# Patient Record
Sex: Male | Born: 1960 | Race: White | Hispanic: No | Marital: Married | Smoking: Current every day smoker
Health system: Southern US, Community
[De-identification: ages and names within clinical notes are randomized; demographics above are authoritative.]

## PROBLEM LIST (undated history)

## (undated) DIAGNOSIS — I10 Essential (primary) hypertension: Secondary | ICD-10-CM

## (undated) DIAGNOSIS — E7800 Pure hypercholesterolemia, unspecified: Secondary | ICD-10-CM

## (undated) HISTORY — PX: SHOULDER SURGERY: SHX246

## (undated) HISTORY — PX: WRIST SURGERY: SHX841

---

## 2014-09-12 ENCOUNTER — Emergency Department (HOSPITAL_BASED_OUTPATIENT_CLINIC_OR_DEPARTMENT_OTHER): Payer: Worker's Compensation

## 2014-09-12 ENCOUNTER — Encounter (HOSPITAL_BASED_OUTPATIENT_CLINIC_OR_DEPARTMENT_OTHER): Payer: Self-pay

## 2014-09-12 ENCOUNTER — Emergency Department (HOSPITAL_BASED_OUTPATIENT_CLINIC_OR_DEPARTMENT_OTHER)
Admission: EM | Admit: 2014-09-12 | Discharge: 2014-09-12 | Disposition: A | Discharge status: Home / Self Care | Payer: Worker's Compensation | Attending: Emergency Medicine | Admitting: Emergency Medicine

## 2014-09-12 DIAGNOSIS — I10 Essential (primary) hypertension: Secondary | ICD-10-CM | POA: Diagnosis not present

## 2014-09-12 DIAGNOSIS — Y998 Other external cause status: Secondary | ICD-10-CM | POA: Diagnosis not present

## 2014-09-12 DIAGNOSIS — Y9389 Activity, other specified: Secondary | ICD-10-CM | POA: Diagnosis not present

## 2014-09-12 DIAGNOSIS — S4991XA Unspecified injury of right shoulder and upper arm, initial encounter: Secondary | ICD-10-CM | POA: Diagnosis not present

## 2014-09-12 DIAGNOSIS — Z720 Tobacco use: Secondary | ICD-10-CM | POA: Insufficient documentation

## 2014-09-12 DIAGNOSIS — M25511 Pain in right shoulder: Secondary | ICD-10-CM

## 2014-09-12 DIAGNOSIS — X58XXXA Exposure to other specified factors, initial encounter: Secondary | ICD-10-CM | POA: Diagnosis not present

## 2014-09-12 DIAGNOSIS — E780 Pure hypercholesterolemia: Secondary | ICD-10-CM | POA: Insufficient documentation

## 2014-09-12 DIAGNOSIS — Z79899 Other long term (current) drug therapy: Secondary | ICD-10-CM | POA: Diagnosis not present

## 2014-09-12 DIAGNOSIS — Y9289 Other specified places as the place of occurrence of the external cause: Secondary | ICD-10-CM | POA: Insufficient documentation

## 2014-09-12 HISTORY — DX: Pure hypercholesterolemia, unspecified: E78.00

## 2014-09-12 HISTORY — DX: Essential (primary) hypertension: I10

## 2014-09-12 MED ORDER — OXYCODONE-ACETAMINOPHEN 5-325 MG PO TABS: 1.0000 | ORAL_TABLET | ORAL | Status: DC | PRN

## 2014-09-12 NOTE — Discharge Instructions (Signed)
Please rest and ice and use ibuprofen 400 mg 3 times a day. Percocet can be used for breakthrough pain in addition to the ibuprofen; do not drive or operate heavy machinery or drink alcohol all taking this medication. Please follow-up with orthopedic surgeon listed in her discharge instructions.

## 2014-09-12 NOTE — ED Notes (Signed)
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  Full sensation.

## 2014-09-12 NOTE — ED Notes (Addendum)
Information  for 24 hour pharmacy given to pt

## 2014-09-12 NOTE — ED Provider Notes (Signed)
CSN: [id number]     Arrival date & time 09/12/14  1805 History   First MD Initiated Contact with Patient 09/12/14 1908     Chief Complaint  Patient presents with  . Shoulder Pain    HPI Comments: 54 year old male presents with right shoulder pain since last night. 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 As the evening progressed he started experience painful movements and a numbness sensation in his forearm. Upon awakening this morning he was in unable to lift his right extremity due to pain but was able to use his other arm to move it through full range of motion with minimal pain. Reports the pain is more pronounced on the anterior lateral portion of his shoulder. He notes a history of injury to the shoulder required surgical management was unable to note specifically the injury. Patient has not tried any at-home therapies as he was concerned of using OTC medication with his current pressure regimen. Patient denies any other injuries or concerns during evaluation.  Patient is a 54 y.o. male presenting with shoulder pain.  Shoulder Pain   Past Medical History  Diagnosis Date  . Hypertension   . Hypercholesteremia    Past Surgical History  Procedure Laterality Date  . Wrist surgery    . Shoulder surgery     No family history on file. History  Substance Use Topics  . Smoking status: Current Every Day Smoker -- 0.50 packs/day    Types: Cigarettes  . Smokeless tobacco: Not on file  . Alcohol Use: Yes     Comment: occ    Review of Systems  All other systems reviewed and are negative.   Allergies  Review of patient's allergies indicates no known allergies.  Home Medications   Prior to Admission medications   Medication Sig Start Date End Date Taking? Authorizing Provider  atorvastatin (LIPITOR) 40 MG tablet Take 40 mg by mouth daily.   Yes  Historical Provider, MD  lisinopril (PRINIVIL,ZESTRIL) 40 MG tablet Take 40 mg by mouth daily.   Yes Historical Provider, MD   BP 161/85 mmHg  Pulse 82  Temp(Src) 98.5 F (36.9 C) (Oral)  Resp 20  Ht 5\' 9"  (1.753 m)  Wt 218 lb (98.884 kg)  BMI 32.18 kg/m2  SpO2 97% Physical Exam  Constitutional: He is oriented to person, place, and time. He appears well-developed and well-nourished.  HENT:  Head: Normocephalic and atraumatic.  Eyes: Conjunctivae are normal. Pupils are equal, round, and reactive to light. Right eye exhibits no discharge. Left eye exhibits no discharge. No scleral icterus.  Neck: Normal range of motion. No JVD present. No tracheal deviation present.  Cardiovascular: Normal rate, regular rhythm, normal heart sounds and intact distal pulses.  Exam reveals no gallop and no friction rub.   No murmur heard. Pulmonary/Chest: Effort normal and breath sounds normal. No stridor. No respiratory distress. He has no wheezes. He has no rales. He exhibits no tenderness.  Musculoskeletal:  Shoulder exam: Difficult exam due to pain. Decreased active range of motion due to pain/weakness. Full passive pain-free range of motion in all planes. No sinus shoulder asymmetry, depression, bruising swelling or signs of trauma. Decreased sensation lateral bicep from previous shoulder surgery; no new changes and sensory distal to the extremity. Distal grip strength normal bilateral, pulses intact. Pain to palpation over the anterior and medial deltoid. No  pain to palpation of clavicle scapular spine or coracoid process. No gross deformities noted.  Neurological: He is alert and oriented to person, place, and time. Coordination normal.  Psychiatric: He has a normal mood and affect. His behavior is normal. Judgment and thought content normal.  Nursing note and vitals reviewed.   ED Course  Procedures (including critical care time) Labs Review Labs Reviewed - No data to display  Imaging Review Dg  Shoulder Right  09/12/2014   CLINICAL DATA:  54 year old male with right-sided shoulder pain for 1 day.  EXAM: RIGHT SHOULDER - 2+ VIEW  COMPARISON:  No priors.  FINDINGS: Multiple views of the right shoulder demonstrate no acute displaced fracture, subluxation, dislocation, or soft tissue abnormality.  IMPRESSION: No acute radiographic abnormality of the right shoulder.   Electronically Signed   By: Trudie Reed M.D.   On: 09/12/2014 19:30     EKG Interpretation None      MDM   Final diagnoses:  Shoulder pain, acute, right    Imaging: DT shoulder normal  Assessment: Soft tissue shoulder injury/pain  Plan: Patient was given a sling and instructed to use ibuprofen and Tylenol, and ice as needed for pain and inflammation. He was also given oxycodone for breakthrough pain; instructed not to drink, drive, operate heavy machinery while taking it. The patient was instructed to follow-up with orthopedic surgeon as indicated in discharged instructions. Patient understood and agreed to plan, and agreed to follow-up immediately for new or worsening symptoms.        Eyvonne Mechanic, PA-C 09/12/14 5621  Arby Barrette, MD 09/13/14 [phone]

## 2016-09-06 ENCOUNTER — Other Ambulatory Visit: Payer: Self-pay | Admitting: Orthopedic Surgery

## 2016-09-21 ENCOUNTER — Ambulatory Visit (HOSPITAL_COMMUNITY)
Admission: RE | Admit: 2016-09-21 | Discharge: 2016-09-21 | Disposition: A | Discharge status: Home / Self Care | Payer: Worker's Compensation | Source: Ambulatory Visit | Attending: Orthopedic Surgery | Admitting: Orthopedic Surgery

## 2016-09-21 ENCOUNTER — Encounter (HOSPITAL_COMMUNITY): Payer: Self-pay

## 2016-09-21 ENCOUNTER — Encounter (HOSPITAL_COMMUNITY)
Admission: RE | Admit: 2016-09-21 | Discharge: 2016-09-21 | Disposition: A | Discharge status: Home / Self Care | Payer: Worker's Compensation | Source: Ambulatory Visit | Attending: Orthopedic Surgery | Admitting: Orthopedic Surgery

## 2016-09-21 DIAGNOSIS — Z01818 Encounter for other preprocedural examination: Secondary | ICD-10-CM | POA: Insufficient documentation

## 2016-09-21 DIAGNOSIS — M19011 Primary osteoarthritis, right shoulder: Secondary | ICD-10-CM | POA: Insufficient documentation

## 2016-09-21 LAB — ABO/RH: ABO/RH(D): O POS

## 2016-09-21 LAB — COMPREHENSIVE METABOLIC PANEL
ALK PHOS: 89 U/L (ref 38–126)
ALT: 28 U/L (ref 17–63)
AST: 19 U/L (ref 15–41)
Albumin: 4 g/dL (ref 3.5–5.0)
Anion gap: 10 (ref 5–15)
BUN: 9 mg/dL (ref 6–20)
CALCIUM: 9.2 mg/dL (ref 8.9–10.3)
CO2: 26 mmol/L (ref 22–32)
CREATININE: 1.14 mg/dL (ref 0.61–1.24)
Chloride: 104 mmol/L (ref 101–111)
Glucose, Bld: 185 mg/dL — ABNORMAL HIGH (ref 65–99)
Potassium: 4.1 mmol/L (ref 3.5–5.1)
Sodium: 140 mmol/L (ref 135–145)
Total Bilirubin: 0.7 mg/dL (ref 0.3–1.2)
Total Protein: 6.7 g/dL (ref 6.5–8.1)

## 2016-09-21 LAB — CBC WITH DIFFERENTIAL/PLATELET
Basophils Absolute: 0.1 10*3/uL (ref 0.0–0.1)
Basophils Relative: 1 %
Eosinophils Absolute: 0.3 10*3/uL (ref 0.0–0.7)
Eosinophils Relative: 3 %
HCT: 43.2 % (ref 39.0–52.0)
HEMOGLOBIN: 14.7 g/dL (ref 13.0–17.0)
LYMPHS ABS: 2.5 10*3/uL (ref 0.7–4.0)
LYMPHS PCT: 26 %
MCH: 28.6 pg (ref 26.0–34.0)
MCHC: 34 g/dL (ref 30.0–36.0)
MCV: 84 fL (ref 78.0–100.0)
Monocytes Absolute: 0.6 10*3/uL (ref 0.1–1.0)
Monocytes Relative: 6 %
NEUTROS ABS: 6.1 10*3/uL (ref 1.7–7.7)
Neutrophils Relative %: 64 %
Platelets: 300 10*3/uL (ref 150–400)
RBC: 5.14 MIL/uL (ref 4.22–5.81)
RDW: 14 % (ref 11.5–15.5)
WBC: 9.6 10*3/uL (ref 4.0–10.5)

## 2016-09-21 LAB — PROTIME-INR
INR: 0.93
PROTHROMBIN TIME: 12.5 s (ref 11.4–15.2)

## 2016-09-21 LAB — URINALYSIS, ROUTINE W REFLEX MICROSCOPIC
BILIRUBIN URINE: NEGATIVE
Bacteria, UA: NONE SEEN
GLUCOSE, UA: 50 mg/dL — AB
KETONES UR: NEGATIVE mg/dL
LEUKOCYTES UA: NEGATIVE
NITRITE: NEGATIVE
PROTEIN: NEGATIVE mg/dL
SQUAMOUS EPITHELIAL / LPF: NONE SEEN
Specific Gravity, Urine: 1.019 (ref 1.005–1.030)
pH: 8 (ref 5.0–8.0)

## 2016-09-21 LAB — SURGICAL PCR SCREEN
MRSA, PCR: NEGATIVE
Staphylococcus aureus: NEGATIVE

## 2016-09-21 LAB — TYPE AND SCREEN
ABO/RH(D): O POS
Antibody Screen: NEGATIVE

## 2016-09-21 LAB — APTT: APTT: 29 s (ref 24–36)

## 2016-09-21 NOTE — Pre-Procedure Instructions (Signed)
    Ardeen GarlandMichael Myint  09/21/2016      [id number] [place] - [address] [place] [id number] Phone: [phone] Fax: [phone]    Your procedure is scheduled on 09/28/16.  [facility] Admitting at 845 A.M.  Call this number if you have problems the morning of surgery:  [phone]   Remember:  Do not eat food or drink liquids after midnight.  Take these medicines the morning of surgery with A SIP OF WATER --oxycodone   Do not wear jewelry, make-up or nail polish.  Do not wear lotions, powders, or perfumes, or deoderant.  Do not shave 48 hours prior to surgery.  Men may shave face and neck.  Do not bring valuables to the hospital.  [facility] is not responsible for any belongings or valuables.  Contacts, dentures or bridgework may not be worn into surgery.  Leave your suitcase in the car.  After surgery it may be brought to your room.  For patients admitted to the hospital, discharge time will be determined by your treatment team.  Patients discharged the day of surgery will not be allowed to drive home.   Name and phone number of your driver:    Special instructions:  Do not take any aspirin,anti-inflammatories,vitamins,or herbal supplements 5-7 days prior to surgery.  Please read over the following fact sheets that you were given. MRSA Information

## 2016-09-28 ENCOUNTER — Inpatient Hospital Stay (HOSPITAL_COMMUNITY)
Admission: RE | Admit: 2016-09-28 | Discharge: 2016-09-29 | DRG: 483 | Disposition: A | Discharge status: Home / Self Care | Payer: Worker's Compensation | Source: Ambulatory Visit | Attending: Orthopedic Surgery | Admitting: Orthopedic Surgery

## 2016-09-28 ENCOUNTER — Inpatient Hospital Stay (HOSPITAL_COMMUNITY): Payer: Worker's Compensation

## 2016-09-28 ENCOUNTER — Inpatient Hospital Stay (HOSPITAL_COMMUNITY): Payer: Worker's Compensation | Admitting: Anesthesiology

## 2016-09-28 ENCOUNTER — Encounter (HOSPITAL_COMMUNITY)
Admission: RE | Disposition: A | Discharge status: Home / Self Care | Payer: Self-pay | Source: Ambulatory Visit | Attending: Orthopedic Surgery

## 2016-09-28 ENCOUNTER — Encounter (HOSPITAL_COMMUNITY): Payer: Self-pay | Admitting: Urology

## 2016-09-28 DIAGNOSIS — M94211 Chondromalacia, right shoulder: Secondary | ICD-10-CM | POA: Diagnosis present

## 2016-09-28 DIAGNOSIS — E7800 Pure hypercholesterolemia, unspecified: Secondary | ICD-10-CM | POA: Diagnosis present

## 2016-09-28 DIAGNOSIS — M19011 Primary osteoarthritis, right shoulder: Principal | ICD-10-CM | POA: Diagnosis present

## 2016-09-28 DIAGNOSIS — Z791 Long term (current) use of non-steroidal anti-inflammatories (NSAID): Secondary | ICD-10-CM | POA: Diagnosis not present

## 2016-09-28 DIAGNOSIS — M25711 Osteophyte, right shoulder: Secondary | ICD-10-CM | POA: Diagnosis present

## 2016-09-28 DIAGNOSIS — I10 Essential (primary) hypertension: Secondary | ICD-10-CM | POA: Diagnosis present

## 2016-09-28 DIAGNOSIS — Z96611 Presence of right artificial shoulder joint: Secondary | ICD-10-CM

## 2016-09-28 DIAGNOSIS — Z7982 Long term (current) use of aspirin: Secondary | ICD-10-CM | POA: Diagnosis not present

## 2016-09-28 DIAGNOSIS — F17210 Nicotine dependence, cigarettes, uncomplicated: Secondary | ICD-10-CM | POA: Diagnosis present

## 2016-09-28 HISTORY — PX: TOTAL SHOULDER ARTHROPLASTY: SHX126

## 2016-09-28 SURGERY — ARTHROPLASTY, SHOULDER, TOTAL
Anesthesia: Regional | Laterality: Right

## 2016-09-28 MED ORDER — ASPIRIN EC 325 MG PO TBEC
325.0000 mg | DELAYED_RELEASE_TABLET | Freq: Every day | ORAL | Status: DC
  Administered 2016-09-28 – 2016-09-29 (×2): 325 mg via ORAL
  Filled 2016-09-28 (×2): qty 1

## 2016-09-28 MED ORDER — FENTANYL CITRATE (PF) 100 MCG/2ML IJ SOLN
INTRAMUSCULAR | Status: AC
  Administered 2016-09-28: 100 ug
  Filled 2016-09-28: qty 2

## 2016-09-28 MED ORDER — ROCURONIUM BROMIDE 50 MG/5ML IV SOSY
PREFILLED_SYRINGE | INTRAVENOUS | Status: AC
  Filled 2016-09-28: qty 5

## 2016-09-28 MED ORDER — HYDROMORPHONE HCL 1 MG/ML IJ SOLN: 0.2500 mg | INTRAMUSCULAR | Status: DC | PRN

## 2016-09-28 MED ORDER — PROPOFOL 10 MG/ML IV BOLUS
INTRAVENOUS | Status: DC | PRN
  Administered 2016-09-28: 200 mg via INTRAVENOUS

## 2016-09-28 MED ORDER — METHOCARBAMOL 500 MG PO TABS
500.0000 mg | ORAL_TABLET | Freq: Four times a day (QID) | ORAL | Status: DC | PRN
  Administered 2016-09-28 – 2016-09-29 (×2): 500 mg via ORAL
  Filled 2016-09-28 (×2): qty 1

## 2016-09-28 MED ORDER — MIDAZOLAM HCL 2 MG/2ML IJ SOLN
INTRAMUSCULAR | Status: AC
  Administered 2016-09-28: 2 mg
  Filled 2016-09-28: qty 2

## 2016-09-28 MED ORDER — SUCCINYLCHOLINE CHLORIDE 20 MG/ML IJ SOLN
INTRAMUSCULAR | Status: DC | PRN
  Administered 2016-09-28: 130 mg via INTRAVENOUS

## 2016-09-28 MED ORDER — DOCUSATE SODIUM 100 MG PO CAPS
100.0000 mg | ORAL_CAPSULE | Freq: Two times a day (BID) | ORAL | Status: DC
  Administered 2016-09-28 – 2016-09-29 (×2): 100 mg via ORAL
  Filled 2016-09-28 (×2): qty 1

## 2016-09-28 MED ORDER — PROPOFOL 10 MG/ML IV BOLUS
INTRAVENOUS | Status: AC
  Filled 2016-09-28: qty 20

## 2016-09-28 MED ORDER — SODIUM CHLORIDE 0.9% FLUSH
INTRAVENOUS | Status: DC | PRN
  Administered 2016-09-28 (×2): 10 mL

## 2016-09-28 MED ORDER — SCOPOLAMINE 1 MG/3DAYS TD PT72SCOPOLAMINE 1 MG/3DAYS
1.0000 | MEDICATED_PATCH | TRANSDERMAL | Status: DC
Start: 2016-09-28 — End: 2016-09-28
  Administered 2016-09-28: 1.5 mg via TRANSDERMAL
  Filled 2016-09-28: qty 1

## 2016-09-28 MED ORDER — SODIUM CHLORIDE 0.9 % IV SOLN: INTRAVENOUS | Status: DC

## 2016-09-28 MED ORDER — DIPHENHYDRAMINE HCL 12.5 MG/5ML PO ELIX: 12.5000 mg | ORAL_SOLUTION | ORAL | Status: DC | PRN

## 2016-09-28 MED ORDER — 0.9 % SODIUM CHLORIDE (POUR BTL) OPTIME
TOPICAL | Status: DC | PRN
  Administered 2016-09-28: 1000 mL

## 2016-09-28 MED ORDER — ONDANSETRON HCL 4 MG/2ML IJ SOLN
INTRAMUSCULAR | Status: DC | PRN
  Administered 2016-09-28: 4 mg via INTRAVENOUS

## 2016-09-28 MED ORDER — METOCLOPRAMIDE HCL 5 MG/ML IJ SOLN: 5.0000 mg | Freq: Three times a day (TID) | INTRAMUSCULAR | Status: DC | PRN

## 2016-09-28 MED ORDER — MENTHOL 3 MG MT LOZG: 1.0000 | LOZENGE | OROMUCOSAL | Status: DC | PRN

## 2016-09-28 MED ORDER — BUPIVACAINE-EPINEPHRINE (PF) 0.5% -1:200000 IJ SOLN
INTRAMUSCULAR | Status: DC | PRN
  Administered 2016-09-28: 30 mL via PERINEURAL

## 2016-09-28 MED ORDER — SENNOSIDES-DOCUSATE SODIUM 8.6-50 MG PO TABS: 1.0000 | ORAL_TABLET | Freq: Every evening | ORAL | Status: DC | PRN

## 2016-09-28 MED ORDER — METHOCARBAMOL 1000 MG/10ML IJ SOLN
500.0000 mg | Freq: Four times a day (QID) | INTRAVENOUS | Status: DC | PRN
  Filled 2016-09-28: qty 5

## 2016-09-28 MED ORDER — PROMETHAZINE HCL 25 MG/ML IJ SOLN: 6.2500 mg | INTRAMUSCULAR | Status: DC | PRN

## 2016-09-28 MED ORDER — GLYCOPYRROLATE 0.2 MG/ML IJ SOLN
INTRAMUSCULAR | Status: DC | PRN
  Administered 2016-09-28: 0.4 mg via INTRAVENOUS

## 2016-09-28 MED ORDER — ONDANSETRON HCL 4 MG/2ML IJ SOLN: 4.0000 mg | Freq: Four times a day (QID) | INTRAMUSCULAR | Status: DC | PRN

## 2016-09-28 MED ORDER — STERILE WATER FOR IRRIGATION IR SOLN
Status: DC | PRN
  Administered 2016-09-28: 1000 mL

## 2016-09-28 MED ORDER — METOCLOPRAMIDE HCL 5 MG PO TABS: 5.0000 mg | ORAL_TABLET | Freq: Three times a day (TID) | ORAL | Status: DC | PRN

## 2016-09-28 MED ORDER — LACTATED RINGERS IV SOLN
INTRAVENOUS | Status: DC
  Administered 2016-09-28 (×2): via INTRAVENOUS

## 2016-09-28 MED ORDER — MIDAZOLAM HCL 2 MG/2ML IJ SOLN
INTRAMUSCULAR | Status: AC
  Filled 2016-09-28: qty 2

## 2016-09-28 MED ORDER — ALBUTEROL SULFATE HFA 108 (90 BASE) MCG/ACT IN AERS
INHALATION_SPRAY | RESPIRATORY_TRACT | Status: AC
  Filled 2016-09-28: qty 6.7

## 2016-09-28 MED ORDER — POVIDONE-IODINE 7.5 % EX SOLN
Freq: Once | CUTANEOUS | Status: DC
  Filled 2016-09-28: qty 118

## 2016-09-28 MED ORDER — DOCUSATE SODIUM 100 MG PO CAPS
100.0000 mg | ORAL_CAPSULE | Freq: Three times a day (TID) | ORAL | 0 refills | Status: AC | PRN
Start: 2016-09-28 — End: ?

## 2016-09-28 MED ORDER — LIDOCAINE 2% (20 MG/ML) 5 ML SYRINGE
INTRAMUSCULAR | Status: AC
  Filled 2016-09-28: qty 5

## 2016-09-28 MED ORDER — PHENYLEPHRINE HCL 10 MG/ML IJ SOLN
INTRAVENOUS | Status: DC | PRN
  Administered 2016-09-28: 15 ug/min via INTRAVENOUS

## 2016-09-28 MED ORDER — ZOLPIDEM TARTRATE 5 MG PO TABS: 5.0000 mg | ORAL_TABLET | Freq: Every evening | ORAL | Status: DC | PRN

## 2016-09-28 MED ORDER — FENTANYL CITRATE (PF) 250 MCG/5ML IJ SOLN
INTRAMUSCULAR | Status: AC
  Filled 2016-09-28: qty 5

## 2016-09-28 MED ORDER — OXYCODONE-ACETAMINOPHEN 5-325 MG PO TABS: 1.0000 | ORAL_TABLET | ORAL | 0 refills | Status: AC | PRN

## 2016-09-28 MED ORDER — ROCURONIUM BROMIDE 100 MG/10ML IV SOLN
INTRAVENOUS | Status: DC | PRN
  Administered 2016-09-28: 50 mg via INTRAVENOUS

## 2016-09-28 MED ORDER — MORPHINE SULFATE (PF) 2 MG/ML IV SOLN
1.0000 mg | INTRAVENOUS | Status: DC | PRN
  Administered 2016-09-28 – 2016-09-29 (×7): 2 mg via INTRAVENOUS
  Filled 2016-09-28 (×7): qty 1

## 2016-09-28 MED ORDER — PHENOL 1.4 % MT LIQD: 1.0000 | OROMUCOSAL | Status: DC | PRN

## 2016-09-28 MED ORDER — BISACODYL 5 MG PO TBEC
5.0000 mg | DELAYED_RELEASE_TABLET | Freq: Every day | ORAL | Status: DC | PRN
Start: 2016-09-28 — End: 2016-09-29

## 2016-09-28 MED ORDER — CEFAZOLIN SODIUM-DEXTROSE 2-4 GM/100ML-% IV SOLN
2.0000 g | INTRAVENOUS | Status: AC
  Administered 2016-09-28: 2 g via INTRAVENOUS
  Filled 2016-09-28: qty 100

## 2016-09-28 MED ORDER — ALUMINUM HYDROXIDE GEL 320 MG/5ML PO SUSP: 15.0000 mL | ORAL | Status: DC | PRN

## 2016-09-28 MED ORDER — BUPIVACAINE LIPOSOME 1.3 % IJ SUSP
20.0000 mL | INTRAMUSCULAR | Status: AC
  Administered 2016-09-28: 20 mL
  Filled 2016-09-28: qty 20

## 2016-09-28 MED ORDER — FLEET ENEMA 7-19 GM/118ML RE ENEM: 1.0000 | ENEMA | Freq: Once | RECTAL | Status: DC | PRN

## 2016-09-28 MED ORDER — TRANEXAMIC ACID 1000 MG/10ML IV SOLN
1000.0000 mg | INTRAVENOUS | Status: AC
  Administered 2016-09-28: 1000 mg via INTRAVENOUS
  Filled 2016-09-28: qty 10

## 2016-09-28 MED ORDER — OXYCODONE HCL 5 MG PO TABS
5.0000 mg | ORAL_TABLET | ORAL | Status: DC | PRN
  Administered 2016-09-28: 5 mg via ORAL
  Administered 2016-09-29: 10 mg via ORAL
  Filled 2016-09-28: qty 1
  Filled 2016-09-28: qty 2

## 2016-09-28 MED ORDER — ACETAMINOPHEN 500 MG PO TABS
1000.0000 mg | ORAL_TABLET | Freq: Four times a day (QID) | ORAL | Status: AC
  Administered 2016-09-28 – 2016-09-29 (×4): 1000 mg via ORAL
  Filled 2016-09-28 (×4): qty 2

## 2016-09-28 MED ORDER — CEFAZOLIN SODIUM-DEXTROSE 2-4 GM/100ML-% IV SOLN
2.0000 g | Freq: Three times a day (TID) | INTRAVENOUS | Status: AC
  Administered 2016-09-28 – 2016-09-29 (×3): 2 g via INTRAVENOUS
  Filled 2016-09-28 (×4): qty 100

## 2016-09-28 MED ORDER — NEOSTIGMINE METHYLSULFATE 10 MG/10ML IV SOLN
INTRAVENOUS | Status: DC | PRN
  Administered 2016-09-28: 3 mg via INTRAVENOUS

## 2016-09-28 MED ORDER — SODIUM CHLORIDE 0.9 % IR SOLN
Status: DC | PRN
  Administered 2016-09-28: 3000 mL

## 2016-09-28 MED ORDER — ONDANSETRON HCL 4 MG PO TABS: 4.0000 mg | ORAL_TABLET | Freq: Four times a day (QID) | ORAL | Status: DC | PRN

## 2016-09-28 SURGICAL SUPPLY — 73 items
AEQUALIS PERFORM GUIDE WIRE COCR ×3 IMPLANT
BIT DRILL 5/64X5 DISP (BIT) ×3 IMPLANT
BLADE SAW SAG 73X25 THK (BLADE) ×2
BLADE SAW SGTL 73X25 THK (BLADE) ×1 IMPLANT
BLADE SURG 15 STRL LF DISP TIS (BLADE) ×1 IMPLANT
BLADE SURG 15 STRL SS (BLADE) ×2
CAP SHOULDER TOTAL 2 ×3 IMPLANT
CEMENT BONE DEPUY (Cement) ×3 IMPLANT
CHLORAPREP W/TINT 26ML (MISCELLANEOUS) ×3 IMPLANT
CLOSURE STERI-STRIP 1/2X4 (GAUZE/BANDAGES/DRESSINGS) ×1
CLOSURE WOUND 1/2 X4 (GAUZE/BANDAGES/DRESSINGS) ×1
CLSR STERI-STRIP ANTIMIC 1/2X4 (GAUZE/BANDAGES/DRESSINGS) ×2 IMPLANT
COVER SURGICAL LIGHT HANDLE (MISCELLANEOUS) ×3 IMPLANT
DRAPE INCISE IOBAN 66X45 STRL (DRAPES) ×3 IMPLANT
DRAPE ORTHO SPLIT 77X108 STRL (DRAPES) ×4
DRAPE SURG 17X23 STRL (DRAPES) ×3 IMPLANT
DRAPE SURG ORHT 6 SPLT 77X108 (DRAPES) ×2 IMPLANT
DRAPE U-SHAPE 47X51 STRL (DRAPES) ×3 IMPLANT
DRSG AQUACEL AG ADV 3.5X10 (GAUZE/BANDAGES/DRESSINGS) ×3 IMPLANT
ELECT BLADE 4.0 EZ CLEAN MEGAD (MISCELLANEOUS)
ELECT REM PT RETURN 9FT ADLT (ELECTROSURGICAL) ×3
ELECTRODE BLDE 4.0 EZ CLN MEGD (MISCELLANEOUS) IMPLANT
ELECTRODE REM PT RTRN 9FT ADLT (ELECTROSURGICAL) ×1 IMPLANT
EVACUATOR 1/8 PVC DRAIN (DRAIN) IMPLANT
GLOVE BIO SURGEON STRL SZ7 (GLOVE) ×6 IMPLANT
GLOVE BIO SURGEON STRL SZ7.5 (GLOVE) ×3 IMPLANT
GLOVE BIOGEL PI IND STRL 7.0 (GLOVE) ×1 IMPLANT
GLOVE BIOGEL PI IND STRL 8 (GLOVE) ×1 IMPLANT
GLOVE BIOGEL PI INDICATOR 7.0 (GLOVE) ×2
GLOVE BIOGEL PI INDICATOR 8 (GLOVE) ×2
GOWN STRL REUS W/ TWL LRG LVL3 (GOWN DISPOSABLE) ×1 IMPLANT
GOWN STRL REUS W/ TWL XL LVL3 (GOWN DISPOSABLE) ×1 IMPLANT
GOWN STRL REUS W/TWL LRG LVL3 (GOWN DISPOSABLE) ×2
GOWN STRL REUS W/TWL XL LVL3 (GOWN DISPOSABLE) ×2
HANDPIECE INTERPULSE COAX TIP (DISPOSABLE) ×2
HEMOSTAT SURGICEL 2X14 (HEMOSTASIS) ×3 IMPLANT
HOOD PEEL AWAY FLYTE STAYCOOL (MISCELLANEOUS) ×6 IMPLANT
IV NS IRRIG 3000ML ARTHROMATIC (IV SOLUTION) ×3 IMPLANT
KIT BASIN OR (CUSTOM PROCEDURE TRAY) ×3 IMPLANT
KIT ROOM TURNOVER OR (KITS) ×3 IMPLANT
MANIFOLD NEPTUNE II (INSTRUMENTS) ×3 IMPLANT
NEEDLE HYPO 25GX1X1/2 BEV (NEEDLE) IMPLANT
NEEDLE MAYO TROCAR (NEEDLE) ×3 IMPLANT
NEEDLE SPNL 18GX3.5 QUINCKE PK (NEEDLE) ×6 IMPLANT
NS IRRIG 1000ML POUR BTL (IV SOLUTION) ×3 IMPLANT
PACK SHOULDER (CUSTOM PROCEDURE TRAY) ×3 IMPLANT
PAD ARMBOARD 7.5X6 YLW CONV (MISCELLANEOUS) ×6 IMPLANT
RESTRAINT HEAD UNIVERSAL NS (MISCELLANEOUS) ×3 IMPLANT
RETRIEVER SUT HEWSON (MISCELLANEOUS) ×3 IMPLANT
SET HNDPC FAN SPRY TIP SCT (DISPOSABLE) ×1 IMPLANT
SLING ARM IMMOBILIZER LRG (SOFTGOODS) ×3 IMPLANT
SLING ARM IMMOBILIZER MED (SOFTGOODS) IMPLANT
SMARTMIX MINI TOWER (MISCELLANEOUS) ×3
SPONGE LAP 18X18 X RAY DECT (DISPOSABLE) IMPLANT
SPONGE LAP 4X18 X RAY DECT (DISPOSABLE) IMPLANT
STRIP CLOSURE SKIN 1/2X4 (GAUZE/BANDAGES/DRESSINGS) ×2 IMPLANT
SUCTION FRAZIER HANDLE 10FR (MISCELLANEOUS) ×2
SUCTION TUBE FRAZIER 10FR DISP (MISCELLANEOUS) ×1 IMPLANT
SUPPORT WRAP ARM LG (MISCELLANEOUS) ×3 IMPLANT
SUT ETHIBOND NAB CT1 #1 30IN (SUTURE) ×9 IMPLANT
SUT MNCRL AB 4-0 PS2 18 (SUTURE) ×3 IMPLANT
SUT SILK 2 0 TIES 17X18 (SUTURE)
SUT SILK 2-0 18XBRD TIE BLK (SUTURE) IMPLANT
SUT VIC AB 2-0 CT1 27 (SUTURE) ×2
SUT VIC AB 2-0 CT1 TAPERPNT 27 (SUTURE) ×1 IMPLANT
SYR 50ML LL SCALE MARK (SYRINGE) ×6 IMPLANT
SYR CONTROL 10ML LL (SYRINGE) IMPLANT
TAPE LABRALWHITE 1.5X36 (TAPE) ×6 IMPLANT
TAPE SUT LABRALTAP WHT/BLK (SUTURE) ×3 IMPLANT
TOWEL OR 17X24 6PK STRL BLUE (TOWEL DISPOSABLE) ×3 IMPLANT
TOWEL OR 17X26 10 PK STRL BLUE (TOWEL DISPOSABLE) ×3 IMPLANT
TOWER SMARTMIX MINI (MISCELLANEOUS) ×1 IMPLANT
WATER STERILE IRR 1000ML POUR (IV SOLUTION) ×3 IMPLANT

## 2016-09-28 NOTE — H&P (Signed)
Drew Curtis is an 56 y.o. male.   Chief Complaint: R shoulder pain and dysfunction  HPI: s/p injury at work with R shoulder severe chondromalacia, failed conservative treatment including injections, meds, activity modification and arthroscopic debridement. Pain interferes with sleep, work and quality of life.  Past Medical History:  Diagnosis Date  . Hypercholesteremia   . Hypertension    no meds    Past Surgical History:  Procedure Laterality Date  . SHOULDER SURGERY    . WRIST SURGERY      History reviewed. No pertinent family history. Social History:  reports that he has been smoking Cigarettes.  He has a 17.50 pack-year smoking history. He has never used smokeless tobacco. He reports that he drinks alcohol. He reports that he does not use drugs.  Allergies: No Known Allergies  Medications Prior to Admission  Medication Sig Dispense Refill  . aspirin EC 81 MG tablet Take 81 mg by mouth daily as needed for mild pain.     . meloxicam (MOBIC) 7.5 MG tablet Take 7.5 mg by mouth 2 (two) times daily as needed for pain.   0  . oxyCODONE-acetaminophen (ROXICET) 5-325 MG per tablet Take 1-2 tablets by mouth every 4 (four) hours as needed for severe pain. (Patient not taking: Reported on 09/15/2016) 15 tablet 0    No results found for this or any previous visit (from the past 48 hour(s)). No results found.  Review of Systems  All other systems reviewed and are negative.   Blood pressure (!) 164/85, pulse 74, temperature 98.8 F (37.1 C), temperature source Oral, resp. rate 19, SpO2 100 %. Physical Exam  Constitutional: He is oriented to person, place, and time. He appears well-developed and well-nourished.  HENT:  Head: Atraumatic.  Eyes: EOM are normal.  Cardiovascular: Intact distal pulses.   Respiratory: Effort normal.  Musculoskeletal:  R shoulder pain with ROM. NVID  Neurological: He is alert and oriented to person, place, and time.  Skin: Skin is warm and dry.   Psychiatric: He has a normal mood and affect.     Assessment/Plan  s/p injury at work with R shoulder severe chondromalacia, failed conservative treatment including injections, meds, activity modification and arthroscopic debridement. Pain interferes with sleep, work and quality of life.  Plan R TSA Risks / benefits of surgery discussed Consent on chart  NPO for OR Preop antibiotics   Mable ParisHANDLER,Drew Curtis WILLIAM, MD 09/28/2016, 9:42 AM

## 2016-09-28 NOTE — Discharge Instructions (Signed)
Discharge Instructions after Total Shoulder Arthroplasty   A sling has been provided for you. Remove the sling 5 times each day to perform motion exercises. Keep wearing your sling (expect for bathing and dressing) until your first visit with Dr. Ave Filterhandler. Use ice on the shoulder intermittently over the first 48 hours after surgery.  Pain medication has been prescribed for you.  Use your medication liberally over the first 48 hours, and then begin to taper your use. You may take Extra Strength Tylenol or Tylenol only in place of the pain pills. DO NOT take ANY nonsteroidal anti-inflammatory pain medications: Advil, Motrin, Ibuprofen, Aleve, Naproxen, or Naprosyn. Take one aspirin a day for 2 weeks after surgery, unless you have an aspirin sensitivity/allergy or asthma. Leave your dressing on until your first follow up visit.  You may shower with the dressing.  Hold your arm as if you still have your sling on while you shower. Active reaching and lifting are not permitted. You may use the operative arm for activities of daily living that do not require the operative arm to leave the side of the body, such as eating, drinking, bathing, etc.  Three to 5 times each day you should perform assisted overhead reaching and external rotation (outward turning) exercises with the operative arm. You were taught these exercises prior to discharge. Both exercises should be done with the non-operative arm used as the "therapist arm" while the operative arm remains relaxed. Ten of each exercise should be done three to five times each day.   Overhead reach is helping to lift your stiff arm up as high as it will go. To stretch your overhead reach, lie flat on your back, relax, and grasp the wrist of the tight shoulder with your opposite hand. Using the power in your opposite arm, bring the stiff arm up as far as it is comfortable. Start holding it for ten seconds and then work up to where you can hold it for a count of  30. Breathe slowly and deeply while the arm is moved. Repeat this stretch ten times, trying to help the ar up a little higher each time.     External rotation is turning the arm out to the side while your elbow stays close to your body. External rotation is best stretched while you are lying on your back. Hold a cane, yardstick, broom handle, or dowel in both hands. Bend both elbows to a right angle. Use steady, gentle force from your normal arm to rotate the hand of the stiff shoulder out away from your body. Continue the rotation as far as it will go comfortably, holding it there for a count of 10. Repeat this exercise ten times.      Please call [phone] during normal business hours or [phone] after hours for any problems. Including the following:  - excessive redness of the incisions - drainage for more than 4 days - fever of more than 101.5 F  *Please note that pain medications will not be refilled after hours or on weekends.

## 2016-09-28 NOTE — Anesthesia Postprocedure Evaluation (Addendum)
Anesthesia Post Note  Patient: Ardeen GarlandMichael Ofarrell  Procedure(s) Performed: Procedure(s) (LRB): TOTAL SHOULDER ARTHROPLASTY (Right)  Patient location during evaluation: PACU Anesthesia Type: Regional Level of consciousness: sedated Pain management: pain level controlled Vital Signs Assessment: post-procedure vital signs reviewed and stable Respiratory status: spontaneous breathing and respiratory function stable Cardiovascular status: stable Anesthetic complications: no       Last Vitals:  Vitals:   09/28/16 1325 09/28/16 1340  BP: 131/79 128/78  Pulse: (!) 55 (!) 56  Resp: 17 20  Temp:      Last Pain:  Vitals:   09/28/16 0808  TempSrc: Oral                 Bryton Waight DANIEL

## 2016-09-28 NOTE — Anesthesia Procedure Notes (Signed)
Anesthesia Regional Block: Interscalene brachial plexus block   Pre-Anesthetic Checklist: ,, timeout performed, Correct Patient, Correct Site, Correct Laterality, Correct Procedure,, site marked, risks and benefits discussed, Surgical consent,  Pre-op evaluation,  At surgeon's request and post-op pain management  Laterality: Right  Prep: chloraprep       Needles:  Injection technique: Single-shot  Needle Type: Echogenic Stimulator Needle     Needle Length: 5cm  Needle Gauge: 22     Additional Needles:   Procedures: ultrasound guided, nerve stimulator,,,,,,   Nerve Stimulator or Paresthesia:  Response: bicep contraction, 0.48 mA,   Additional Responses:   Narrative:  Start time: 09/28/2016 9:11 AM End time: 09/28/2016 9:21 AM Injection made incrementally with aspirations every 5 mL.  Performed by: Personally   Additional Notes: Functioning IV was confirmed and monitors applied.  A 50mm 22ga echogenic arrow stimulator was used. Sterile prep and drape,hand hygiene and sterile gloves were used.Ultrasound guidance: relevent anatomy identified, needle position confirmed, local anesthetic spread visualized around nerve(s)., vascular puncture avoided.  Image printed for medical record.  Negative aspiration and negative test dose prior to incremental administration of local anesthetic. The patient tolerated the procedure well.

## 2016-09-28 NOTE — Anesthesia Preprocedure Evaluation (Addendum)
Anesthesia Evaluation  Patient identified by MRN, date of birth, ID band Patient awake    Reviewed: Allergy & Precautions, NPO status , Patient's Chart, lab work & pertinent test results  History of Anesthesia Complications Negative for: history of anesthetic complications  Airway Mallampati: III  TM Distance: >3 FB Neck ROM: Full    Dental no notable dental hx. (+) Dental Advisory Given   Pulmonary Current Smoker,    Pulmonary exam normal        Cardiovascular hypertension, Normal cardiovascular exam     Neuro/Psych negative neurological ROS  negative psych ROS   GI/Hepatic negative GI ROS, Neg liver ROS,   Endo/Other  negative endocrine ROS  Renal/GU negative Renal ROS  negative genitourinary   Musculoskeletal negative musculoskeletal ROS (+)   Abdominal   Peds negative pediatric ROS (+)  Hematology negative hematology ROS (+)   Anesthesia Other Findings   Reproductive/Obstetrics negative OB ROS                            Anesthesia Physical Anesthesia Plan  ASA: II  Anesthesia Plan: General   Post-op Pain Management: GA combined w/ Regional for post-op pain   Induction:   Airway Management Planned: Oral ETT  Additional Equipment:   Intra-op Plan:   Post-operative Plan: Extubation in OR  Informed Consent: I have reviewed the patients History and Physical, chart, labs and discussed the procedure including the risks, benefits and alternatives for the proposed anesthesia with the patient or authorized representative who has indicated his/her understanding and acceptance.   Dental advisory given  Plan Discussed with: CRNA, Anesthesiologist and Surgeon  Anesthesia Plan Comments:        Anesthesia Quick Evaluation

## 2016-09-28 NOTE — Progress Notes (Signed)
Patient does not want flu vaccine. 

## 2016-09-28 NOTE — Op Note (Signed)
Procedure(s): TOTAL SHOULDER ARTHROPLASTY Procedure Note  Drew Curtis male 56 y.o. 09/28/2016  Procedure(s) and Anesthesia Type:    *RIGHT TOTAL SHOULDER ARTHROPLASTY - Choice  Surgeon(s) and Role:    * Jones Broom, MD - Primary   Indications:  56 y.o. 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 Pain and dysfunction interfered with quality of life and nonoperative treatment with activity modification, NSAIDS, injections and arthroscopic debridement failed.     Surgeon: Mable Paris   Assistants: Damita Lack PA-C [facility] was present and scrubbed throughout the procedure and was essential in positioning, retraction, exposure, and closure)  Anesthesia: General endotracheal anesthesia with preoperative interscalene block given by the anesthesiologist    Procedure Detail  TOTAL SHOULDER ARTHROPLASTY  Findings: Tornier flex anatomic press-fit size 4 stem with a 52 head, cemented size medium 40 Cortiloc glenoid.   A lesser tuberosity osteotomy was performed and repaired at the conclusion of the procedure.  Estimated Blood Loss:  200 mL         Drains: None   Blood Given: none          Specimens: none        Complications:  * No complications entered in OR log *         Disposition: PACU - hemodynamically stable.         Condition: stable    Procedure:   The patient was identified in the preoperative holding area where I personally marked the operative extremity after verifying with the patient and consent. He  was taken to the operating room where He was transferred to the   operative table.  The patient received an interscalene block in   the holding area by the attending anesthesiologist.  General anesthesia was induced   in the operating room without complication.  The patient did receive IV  Ancef prior to the commencement of the procedure.  The patient was   placed in the beach-chair position with the back raised  about 30   degrees.  The nonoperative extremity and head and neck were carefully   positioned and padded protecting against neurovascular compromise.  The   left upper extremity was then prepped and draped in the standard sterile   fashion.    The appropriate operative time-out was performed with   Anesthesia, the perioperative staff, as well as myself and we all agreed   that the right side was the correct operative site.  An approximately   10 cm incision was made from the tip of the coracoid to the center point of the   humerus at the level of the axilla.  Dissection was carried down sharply   through subcutaneous tissues and cephalic vein was identified and taken   laterally with the deltoid.  The pectoralis major was taken medially.  The   upper 1 cm of the pectoralis major was released from its attachment on   the humerus.  The clavipectoral fascia was incised just lateral to the   conjoined tendon.  This incision was carried up to but not into the   coracoacromial ligament.  Digital palpation was used to prove   integrity of the axillary nerve which was protected throughout the   procedure.  Musculocutaneous nerve was not palpated in the operative   field.  Conjoined tendon was then retracted gently medially and the   deltoid laterally.  Anterior circumflex humeral vessels were clamped and   coagulated.  The soft tissues overlying the  biceps was incised and this   incision was carried across the transverse humeral ligament to the base   of the coracoid.  The biceps was tenodesed to the soft tissue just above   pectoralis major and the remaining portion of the biceps superiorly was   excised.  An osteotomy was performed at the lesser tuberosity.  Capsule was then   released all the way down to the 6 o'clock position of the humeral head.   The humeral head was then delivered with simultaneous adduction,   extension and external rotation.  All humeral osteophytes were removed   and  the anatomic neck of the humerus was marked and cut free hand at   approximately 25 degrees retroversion within about 3 mm of the cuff   reflection posteriorly.  The head size was estimated to be a 52 medium   offset.  At that point, the humeral head was retracted posteriorly with   a Fukuda retractor.   Remaining portion of the capsule was released at the base of the   coracoid.  The remaining biceps anchor and the entire anterior-inferior   labrum was excised.  The posterior labrum was also excised but the   posterior capsule was not released.  The guidepin was placed bicortically with 0 elevated guide.  The reamer was used to ream to concentric bone with punctate bleeding.  This gave an excellent concentric surface.  The center hole was then drilled for an anchor peg glenoid followed by the three peripheral holes and none of the holes   exited the glenoid wall.  I then pulse irrigated these holes and dried   them with Surgicel.  The three peripheral holes were then   pressurized cemented and the anchor peg glenoid was placed and impacted   with an excellent fit.  The glenoid was a 40 medium component.  The proximal humerus was then again exposed taking care not to displace the glenoid.    The entry awl was used followed by sounding reamers and then sequentially broached from size 1 to 3. This was then left in place and the calcar planer was used. Trial head was placed with a 52.  With the trial implantation of the component,  there was approximately 50% posterior translation with immediate snap back to the   anatomic position.  With forward elevation, there was no tendency   towards posterior subluxation.   The trial was removed and the final implant was prepared on a back table.  The trial was removed and the final implant was prepared on a back table.   3 small holes were drilled on the medial side of the lesser tuberosity osteotomy, through which 2 labral tapes were passed. The implant was then  placed through the loop of the 2 labral tapes and impacted with an excellent press-fit. This achieved excellent anatomic reconstruction of the proximal humerus.  The joint was then copiously irrigated with pulse lavage.  The subscapularis and   lesser tuberosity osteotomy were then repaired using the 2 labral tapes previously passed in a double row fashion with horizontal mattress sutures medially brought over through bone tunnels tied over a bone bridge laterally.   One #1 Ethibond was placed at the rotator interval just above   the lesser tuberosity. Copious irrigation was used. Skin was closed with 2-0 Vicryl sutures in the deep dermal layer and 4-0 Monocryl in a subcuticular  running fashion.  Sterile dressings were then applied including Aquacel.  The patient  was placed in a sling and allowed to awaken from general anesthesia and taken to the recovery room in stable condition.      POSTOPERATIVE PLAN:  Early passive range of motion will be allowed with the goal of 0 degrees external rotation and 90 degrees forward elevation.  No internal rotation at this time.  No active motion of the arm until the lesser tuberosity heals.  The patient will likely be kept in the hospital for 1-2 days and then discharged home.

## 2016-09-28 NOTE — Transfer of Care (Signed)
Immediate Anesthesia Transfer of Care Note  Patient: Drew Curtis  Procedure(s) Performed: Procedure(s) with comments: TOTAL SHOULDER ARTHROPLASTY (Right) - Right total shoulder arthroplasty  Patient Location: PACU  Anesthesia Type:General and Regional  Level of Consciousness: awake, alert , oriented and patient cooperative  Airway & Oxygen Therapy: Patient Spontanous Breathing and Patient connected to nasal cannula oxygen  Post-op Assessment: Report given to RN and Post -op Vital signs reviewed and stable  Post vital signs: Reviewed and stable  Last Vitals:  Vitals:   09/28/16 0925 09/28/16 0930  BP:    Pulse: 78 74  Resp: 14 19  Temp:      Last Pain:  Vitals:   09/28/16 0808  TempSrc: Oral         Complications: No apparent anesthesia complications

## 2016-09-28 NOTE — Anesthesia Procedure Notes (Signed)
Procedure Name: Intubation Date/Time: 09/28/2016 10:43 AM Performed by: Shirlyn Goltz Pre-anesthesia Checklist: Patient identified, Emergency Drugs available, Suction available and Patient being monitored Patient Re-evaluated:Patient Re-evaluated prior to inductionOxygen Delivery Method: Circle system utilized Preoxygenation: Pre-oxygenation with 100% oxygen Intubation Type: IV induction Ventilation: Mask ventilation without difficulty Laryngoscope Size: Mac and 4 Grade View: Grade III Tube type: Oral Tube size: 7.5 mm Number of attempts: 1 Airway Equipment and Method: Stylet Placement Confirmation: ETT inserted through vocal cords under direct vision,  positive ETCO2 and breath sounds checked- equal and bilateral Secured at: 22 cm Tube secured with: Tape Dental Injury: Teeth and Oropharynx as per pre-operative assessment

## 2016-09-29 ENCOUNTER — Encounter (HOSPITAL_COMMUNITY): Payer: Self-pay | Admitting: Orthopedic Surgery

## 2016-09-29 LAB — BASIC METABOLIC PANEL
Anion gap: 10 (ref 5–15)
BUN: 10 mg/dL (ref 6–20)
CHLORIDE: 103 mmol/L (ref 101–111)
CO2: 26 mmol/L (ref 22–32)
CREATININE: 1.2 mg/dL (ref 0.61–1.24)
Calcium: 8.5 mg/dL — ABNORMAL LOW (ref 8.9–10.3)
GFR calc Af Amer: >60 mL/min (ref >60)
GFR calc non Af Amer: >60 mL/min (ref >60)
Glucose, Bld: 159 mg/dL — ABNORMAL HIGH (ref 65–99)
Potassium: 3.9 mmol/L (ref 3.5–5.1)
SODIUM: 139 mmol/L (ref 135–145)

## 2016-09-29 LAB — CBC
HCT: 40.3 % (ref 39.0–52.0)
HEMOGLOBIN: 13 g/dL (ref 13.0–17.0)
MCH: 27.8 pg (ref 26.0–34.0)
MCHC: 32.3 g/dL (ref 30.0–36.0)
MCV: 86.1 fL (ref 78.0–100.0)
PLATELETS: 264 10*3/uL (ref 150–400)
RBC: 4.68 MIL/uL (ref 4.22–5.81)
RDW: 14.8 % (ref 11.5–15.5)
WBC: 12.6 10*3/uL — ABNORMAL HIGH (ref 4.0–10.5)

## 2016-09-29 MED ORDER — METHOCARBAMOL 750 MG PO TABS: 750.0000 mg | ORAL_TABLET | Freq: Three times a day (TID) | ORAL | 0 refills | Status: AC | PRN

## 2016-09-29 NOTE — Progress Notes (Signed)
   PATIENT ID: Drew Curtis   1 Day Post-Op Procedure(s) (LRB): TOTAL SHOULDER ARTHROPLASTY (Right)  Subjective: Doing well, block wore off around mn. Pain well controlled.  Objective:  Vitals:   09/29/16 0011 09/29/16 0525  BP: 136/73 128/66  Pulse: 67 69  Resp: (!) 22 18  Temp: 99.8 F (37.7 C) 99.3 F (37.4 C)     sml blood on dressing. NVID. Sling intact  Labs:   Recent Labs  09/29/16 0315  HGB 13.0   Recent Labs  09/29/16 0315  WBC 12.6*  RBC 4.68  HCT 40.3  PLT 264   Recent Labs  09/29/16 0315  NA 139  K 3.9  CL 103  CO2 26  BUN 10  CREATININE 1.20  GLUCOSE 159*  CALCIUM 8.5*    Assessment and Plan:doing well POD1 OT today D/c home today f/u 2 wks  VTE proph: [facility]

## 2016-09-29 NOTE — Evaluation (Signed)
Occupational Therapy Evaluation Patient Details Name: Drew Curtis MRN: [id number] DOB: 1961-04-18 Today's Date: 09/29/2016    History of Present Illness 56 yo male s/p R TOTAL SHOULDER ARTHROPLASTY    Clinical Impression   Patient is s/p R TSA surgery resulting in functional limitations due to the deficits listed below (see OT problem list). PTA was independent with all adls. Pt currently with limited ROM with exercises due to pain. Rn arriving during session and encouraged oral medications vs IV. Pt with multiple IV morphine instead of oral medications that are expected for d/c order set. Pt agreeable and taking during session.  Patient will benefit from skilled OT acutely to increase independence and safety with ADLS to allow discharge HOMe with MD follow up in 2 weeks.     Follow Up Recommendations  No OT follow up    Equipment Recommendations  None recommended by OT    Recommendations for Other Services       Precautions / Restrictions Precautions Precautions: Shoulder Type of Shoulder Precautions: passive protocol Shoulder Interventions: Shoulder sling/immobilizer;Off for dressing/bathing/exercises;At all times Precaution Comments: shoulder handout provided and reviwed in detail Required Braces or Orthoses: Sling Restrictions Weight Bearing Restrictions: No      Mobility Bed Mobility Overal bed mobility: Independent             General bed mobility comments: exiting on L to avoid wb on shoulder  Transfers Overall transfer level: Modified independent   Transfers: Sit to/from Stand Sit to Stand: Modified independent (Device/Increase time)              Balance                                           ADL either performed or assessed with clinical judgement   ADL Overall ADL's : Needs assistance/impaired                                       General ADL Comments: pt supine on arrival with poor positioning. Pt  encouraged OOB to chair and reports increased comfort. ice applied to shoulder. see below for shoulder education  Pt educated on bathing and avoid washing directly on incision. Pt educated to use new wash cloth and towel each day. Pt educated to allow water to run across dressing and not to soak in a tub at this time. All education is complete and patient indicates understanding.      Vision         Perception     Praxis      Pertinent Vitals/Pain Pain Assessment: Faces Faces Pain Scale: Hurts whole lot Pain Location: shoulder reports needing medications Pain Descriptors / Indicators: Grimacing Pain Intervention(s): Repositioned;Monitored during session;Premedicated before session;Ice applied     Hand Dominance     Extremity/Trunk Assessment Upper Extremity Assessment Upper Extremity Assessment: RUE deficits/detail RUE Deficits / Details: s/p surg   Lower Extremity Assessment Lower Extremity Assessment: Overall WFL for tasks assessed       Communication Communication Communication: No difficulties   Cognition Arousal/Alertness: Awake/alert Behavior During Therapy: WFL for tasks assessed/performed Overall Cognitive Status: Within Functional Limits for tasks assessed  General Comments  dressing intact and dry .     Exercises Exercises: Shoulder Shoulder Exercises Shoulder Flexion: PROM;Right;5 reps;Supine (reached ~ 45 degrees) Shoulder External Rotation: PROM;Right;10 reps (neutral) Elbow Flexion: AROM;Right;10 reps;Supine Wrist Flexion: AROM;Right;10 reps;Supine Digit Composite Flexion: AROM;Right;10 reps;Supine Donning/doffing shirt without moving shoulder: Minimal assistance Method for sponge bathing under operated UE: Minimal assistance Donning/doffing sling/immobilizer: Min-guard Correct positioning of sling/immobilizer: Min-guard ROM for elbow, wrist and digits of operated UE: Independent Sling wearing  schedule (on at all times/off for ADL's): Independent Proper positioning of operated UE when showering: Supervision/safety Positioning of UE while sleeping: Min-guard   Shoulder Instructions Shoulder Instructions Donning/doffing shirt without moving shoulder: Minimal assistance Method for sponge bathing under operated UE: Minimal assistance Donning/doffing sling/immobilizer: Min-guard Correct positioning of sling/immobilizer: Min-guard ROM for elbow, wrist and digits of operated UE: Independent Sling wearing schedule (on at all times/off for ADL's): Independent Proper positioning of operated UE when showering: Supervision/safety Positioning of UE while sleeping: Min-guard    Home Living Family/patient expects to be discharged to:: Private residence Living Arrangements: Spouse/significant other Available Help at Discharge: Family;Available PRN/intermittently Type of Home: House                 Bathroom Toilet: Standard     Home Equipment: None          Prior Functioning/Environment Level of Independence: Independent                 OT Problem List: Decreased strength;Decreased range of motion;Decreased activity tolerance;Impaired UE functional use      OT Treatment/Interventions: Self-care/ADL training;Therapeutic exercise;Therapeutic activities;Balance training;Patient/family education    OT Goals(Current goals can be found in the care plan section) Acute Rehab OT Goals Patient Stated Goal: to get my pain under control OT Goal Formulation: With patient Time For Goal Achievement: 10/13/16 Potential to Achieve Goals: Good  OT Frequency: Min 2X/week   Barriers to D/C:            Co-evaluation              End of Session Nurse Communication: Mobility status;Precautions  Activity Tolerance: Patient tolerated treatment well Patient left: in chair;with call bell/phone within reach  OT Visit Diagnosis: Unsteadiness on feet (R26.81)                 Time: 1324-4010 OT Time Calculation (min): 19 min Charges:  OT General Charges $OT Visit: 1 Procedure OT Evaluation $OT Eval Moderate Complexity: 1 Procedure G-Codes:      Mateo Flow   OTR/L Pager: [phone] Office: [phone] .   Boone Master B 09/29/2016, 1:27 PM

## 2016-09-29 NOTE — Discharge Summary (Signed)
Patient ID: Drew Curtis MRN: [id number] DOB/AGE: 05-Nov-1960 56 y.o.  Admit date: 09/28/2016 Discharge date: 09/29/2016  Admission Diagnoses:  Active Problems:   S/P shoulder replacement, right   Discharge Diagnoses:  Same  Past Medical History:  Diagnosis Date  . Hypercholesteremia   . Hypertension    no meds    Surgeries: Procedure(s): TOTAL SHOULDER ARTHROPLASTY on 09/28/2016   Consultants:   Discharged Condition: Improved  Hospital Course: Drew Curtis is an 56 y.o. male who was admitted 09/28/2016 for operative treatment of shoulder arthritis. Patient has severe unremitting pain that affects sleep, daily activities, and work/hobbies. After pre-op clearance the patient was taken to the operating room on 09/28/2016 and underwent  Procedure(s): TOTAL SHOULDER ARTHROPLASTY.    Patient was given perioperative antibiotics: Anti-infectives    Start     Dose/Rate Route Frequency Ordered Stop   09/28/16 1800  ceFAZolin (ANCEF) IVPB 2g/100 mL premix     2 g 200 mL/hr over 30 Minutes Intravenous Every 8 hours 09/28/16 1637 09/29/16 1759   09/28/16 0453  ceFAZolin (ANCEF) IVPB 2g/100 mL premix     2 g 200 mL/hr over 30 Minutes Intravenous On call to O.R. 09/28/16 0453 09/28/16 1045       Patient was given sequential compression devices, early ambulation, and chemoprophylaxis to prevent DVT.  Patient benefited maximally from hospital stay and there were no complications.    Recent vital signs: Patient Vitals for the past 24 hrs:  BP Temp Temp src Pulse Resp SpO2  09/29/16 0525 128/66 99.3 F (37.4 C) Oral 69 18 94 %  09/29/16 0011 136/73 99.8 F (37.7 C) Oral 67 (!) 22 97 %  09/28/16 2016 114/62 98.9 F (37.2 C) Oral 62 18 94 %  09/28/16 1615 (!) 147/73 98.7 F (37.1 C) Oral 73 20 98 %  09/28/16 1555 133/73 - - 82 (!) 26 95 %  09/28/16 1540 126/70 97.5 F (36.4 C) - 69 (!) 23 97 %  09/28/16 1525 117/72 - - 70 (!) 26 96 %  09/28/16 1510 132/74 - - 71 (!) 24 97 %   09/28/16 1440 127/72 - - 72 (!) 21 98 %  09/28/16 1425 139/84 - - 70 20 98 %  09/28/16 1410 (!) 147/84 - - 65 19 99 %  09/28/16 1355 129/74 - - (!) 57 20 100 %  09/28/16 1340 128/78 - - (!) 56 20 99 %  09/28/16 1325 131/79 - - (!) 55 17 99 %  09/28/16 1310 140/83 - - (!) 59 (!) 21 98 %  09/28/16 1255 - 97.7 F (36.5 C) - - - -  09/28/16 0930 - - - 74 19 100 %  09/28/16 0925 - - - 78 14 100 %  09/28/16 0915 - - - (!) 56 18 99 %  09/28/16 0910 - - - (!) 57 18 100 %  09/28/16 0905 - - - 63 15 98 %     Recent laboratory studies:  Recent Labs  09/29/16 0315  WBC 12.6*  HGB 13.0  HCT 40.3  PLT 264  NA 139  K 3.9  CL 103  CO2 26  BUN 10  CREATININE 1.20  GLUCOSE 159*  CALCIUM 8.5*     Discharge Medications:   Allergies as of 09/29/2016   No Known Allergies     Medication List    STOP taking these medications   meloxicam 7.5 MG tablet Commonly known as:  MOBIC     TAKE these medications  aspirin EC 81 MG tablet Take 81 mg by mouth daily as needed for mild pain.   docusate sodium 100 MG capsule Commonly known as:  COLACE Take 1 capsule (100 mg total) by mouth 3 (three) times daily as needed.   oxyCODONE-acetaminophen 5-325 MG tablet Commonly known as:  ROXICET Take 1-2 tablets by mouth every 4 (four) hours as needed for severe pain.       Diagnostic Studies: Dg Chest 2 View  Result Date: 09/21/2016 CLINICAL DATA:  Preop right shoulder surgery 09/28/2016.  Cough. EXAM: CHEST  2 VIEW COMPARISON:  None. FINDINGS: The heart size and mediastinal contours are within normal limits. Both lungs are clear. The visualized skeletal structures are unremarkable. IMPRESSION: No active cardiopulmonary disease. Electronically Signed   By: Elberta Fortis M.D.   On: 09/21/2016 16:07   Dg Shoulder Right Port  Result Date: 09/28/2016 CLINICAL DATA:  Status post total shoulder replacement EXAM: PORTABLE RIGHT SHOULDER:  1 V COMPARISON:  None. FINDINGS: Frontal view obtained.  Total shoulder prosthesis well-seated. No fracture or dislocation. Evidence of old trauma involving the lateral right clavicle noted. Soft tissue air is an expected postoperative finding. IMPRESSION: Frontal view shows total shoulder prosthesis well-seated. No acute fracture or dislocation. Electronically Signed   By: Bretta Bang III M.D.   On: 09/28/2016 13:29    Disposition: 01-Home or Self Care  Discharge Instructions    Call MD / Call 911    Complete by:  As directed    If you experience chest pain or shortness of breath, CALL 911 and be transported to the hospital emergency room.  If you develope a fever above 101 F, pus (white drainage) or increased drainage or redness at the wound, or calf pain, call your surgeon's office.   Call MD / Call 911    Complete by:  As directed    If you experience chest pain or shortness of breath, CALL 911 and be transported to the hospital emergency room.  If you develope a fever above 101 F, pus (white drainage) or increased drainage or redness at the wound, or calf pain, call your surgeon's office.   Constipation Prevention    Complete by:  As directed    Drink plenty of fluids.  Prune juice may be helpful.  You may use a stool softener, such as Colace (over the counter) 100 mg twice a day.  Use MiraLax (over the counter) for constipation as needed.   Constipation Prevention    Complete by:  As directed    Drink plenty of fluids.  Prune juice may be helpful.  You may use a stool softener, such as Colace (over the counter) 100 mg twice a day.  Use MiraLax (over the counter) for constipation as needed.   Diet - low sodium heart healthy    Complete by:  As directed    Diet - low sodium heart healthy    Complete by:  As directed    Increase activity slowly as tolerated    Complete by:  As directed    Increase activity slowly as tolerated    Complete by:  As directed       Follow-up Information    Mable Paris, MD. Schedule an  appointment as soon as possible for a visit in 2 weeks.   Specialty:  Orthopedic Surgery Contact information: [address] [place] [address] [phone]            Signed: Mable Paris 09/29/2016, 8:20  AM     

## 2016-12-02 NOTE — Addendum Note (Signed)
Addendum  created 12/02/16 1112 by Annai Heick, MD   Sign clinical note    

## 2018-02-20 IMAGING — CR DG CHEST 2V
2 series · 2 of 2 positions shown · non-contrast
Comparison: None.

CLINICAL DATA: Preop right shoulder surgery 09/28/2016.  Cough.

EXAM:
CHEST  2 VIEW

[w chest pa]
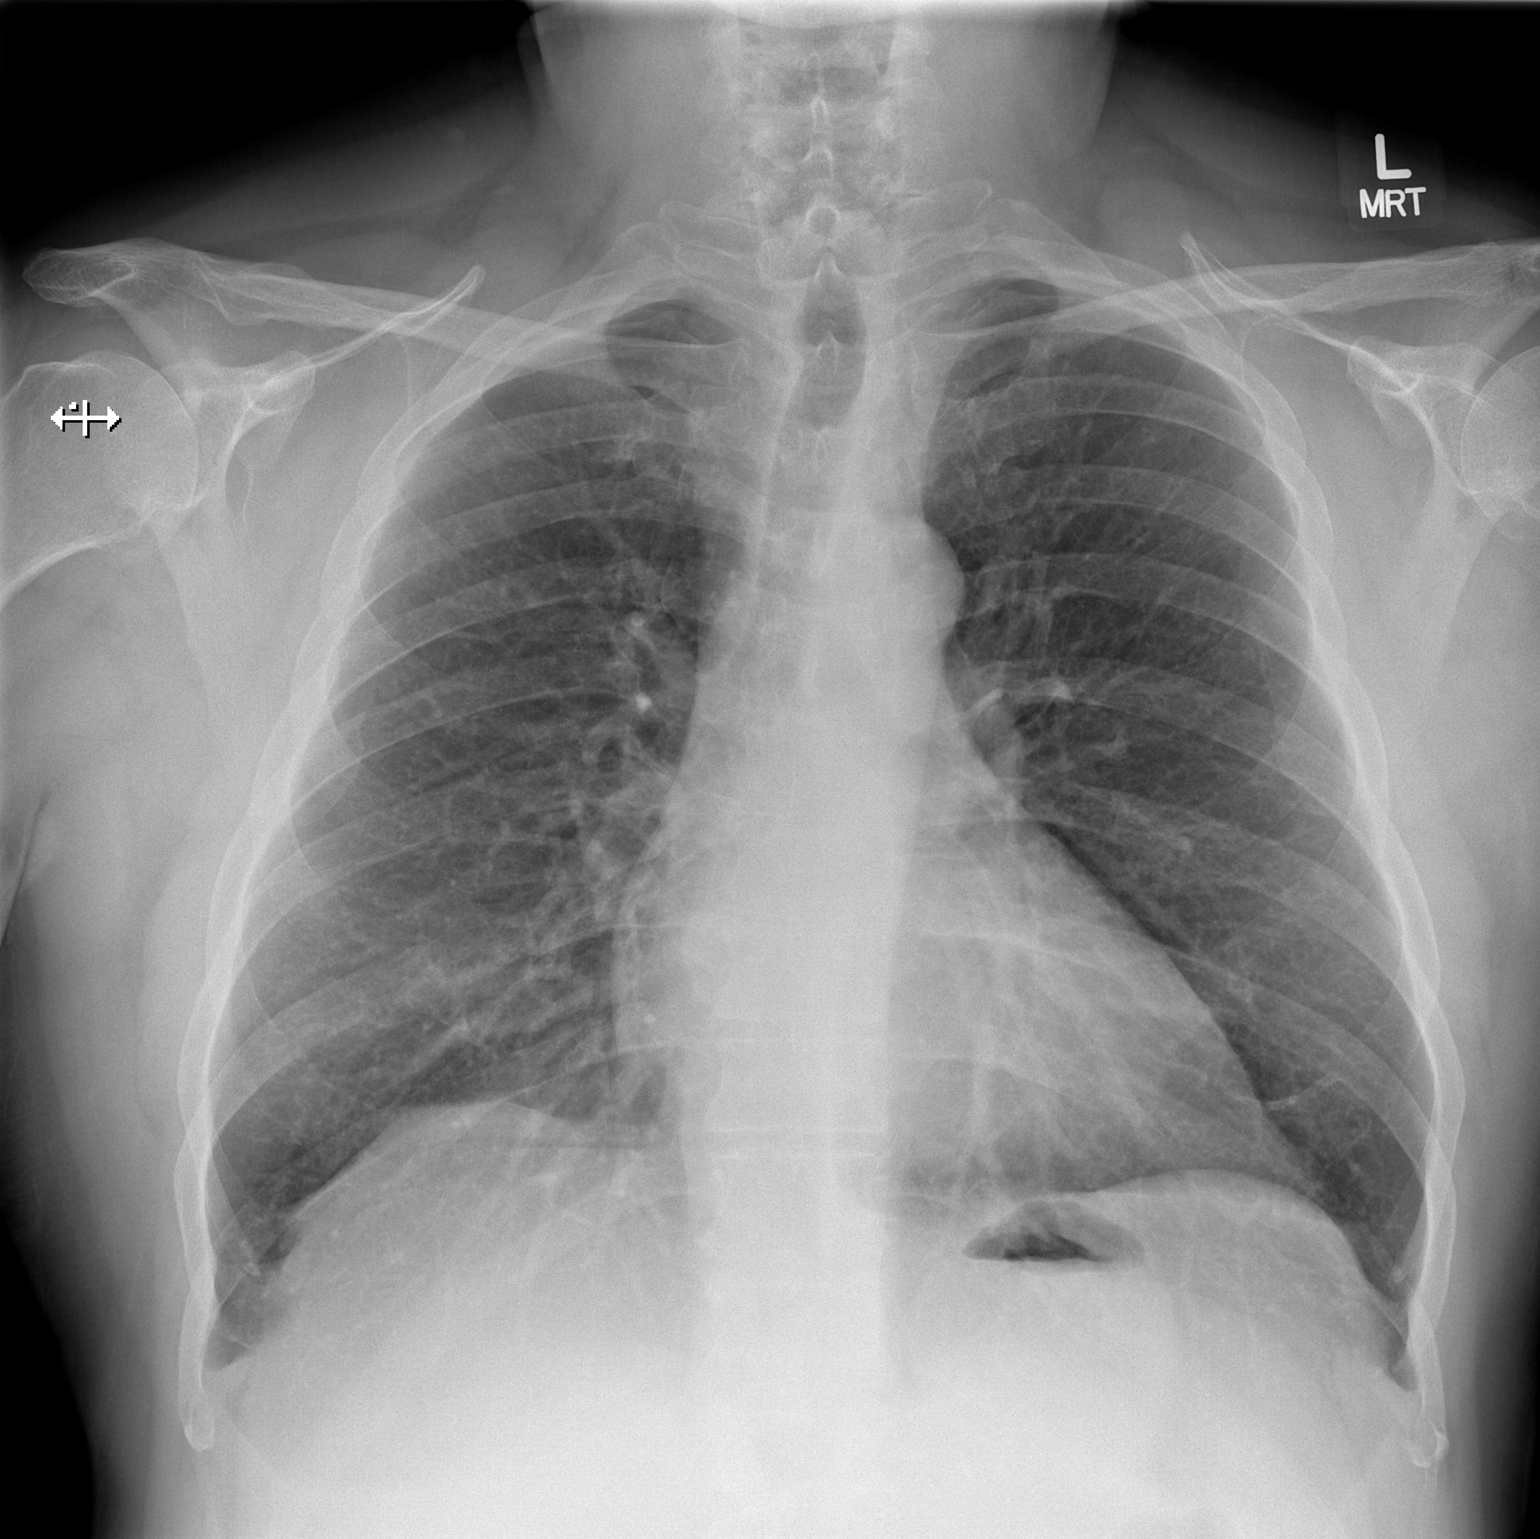

[w chest lat]
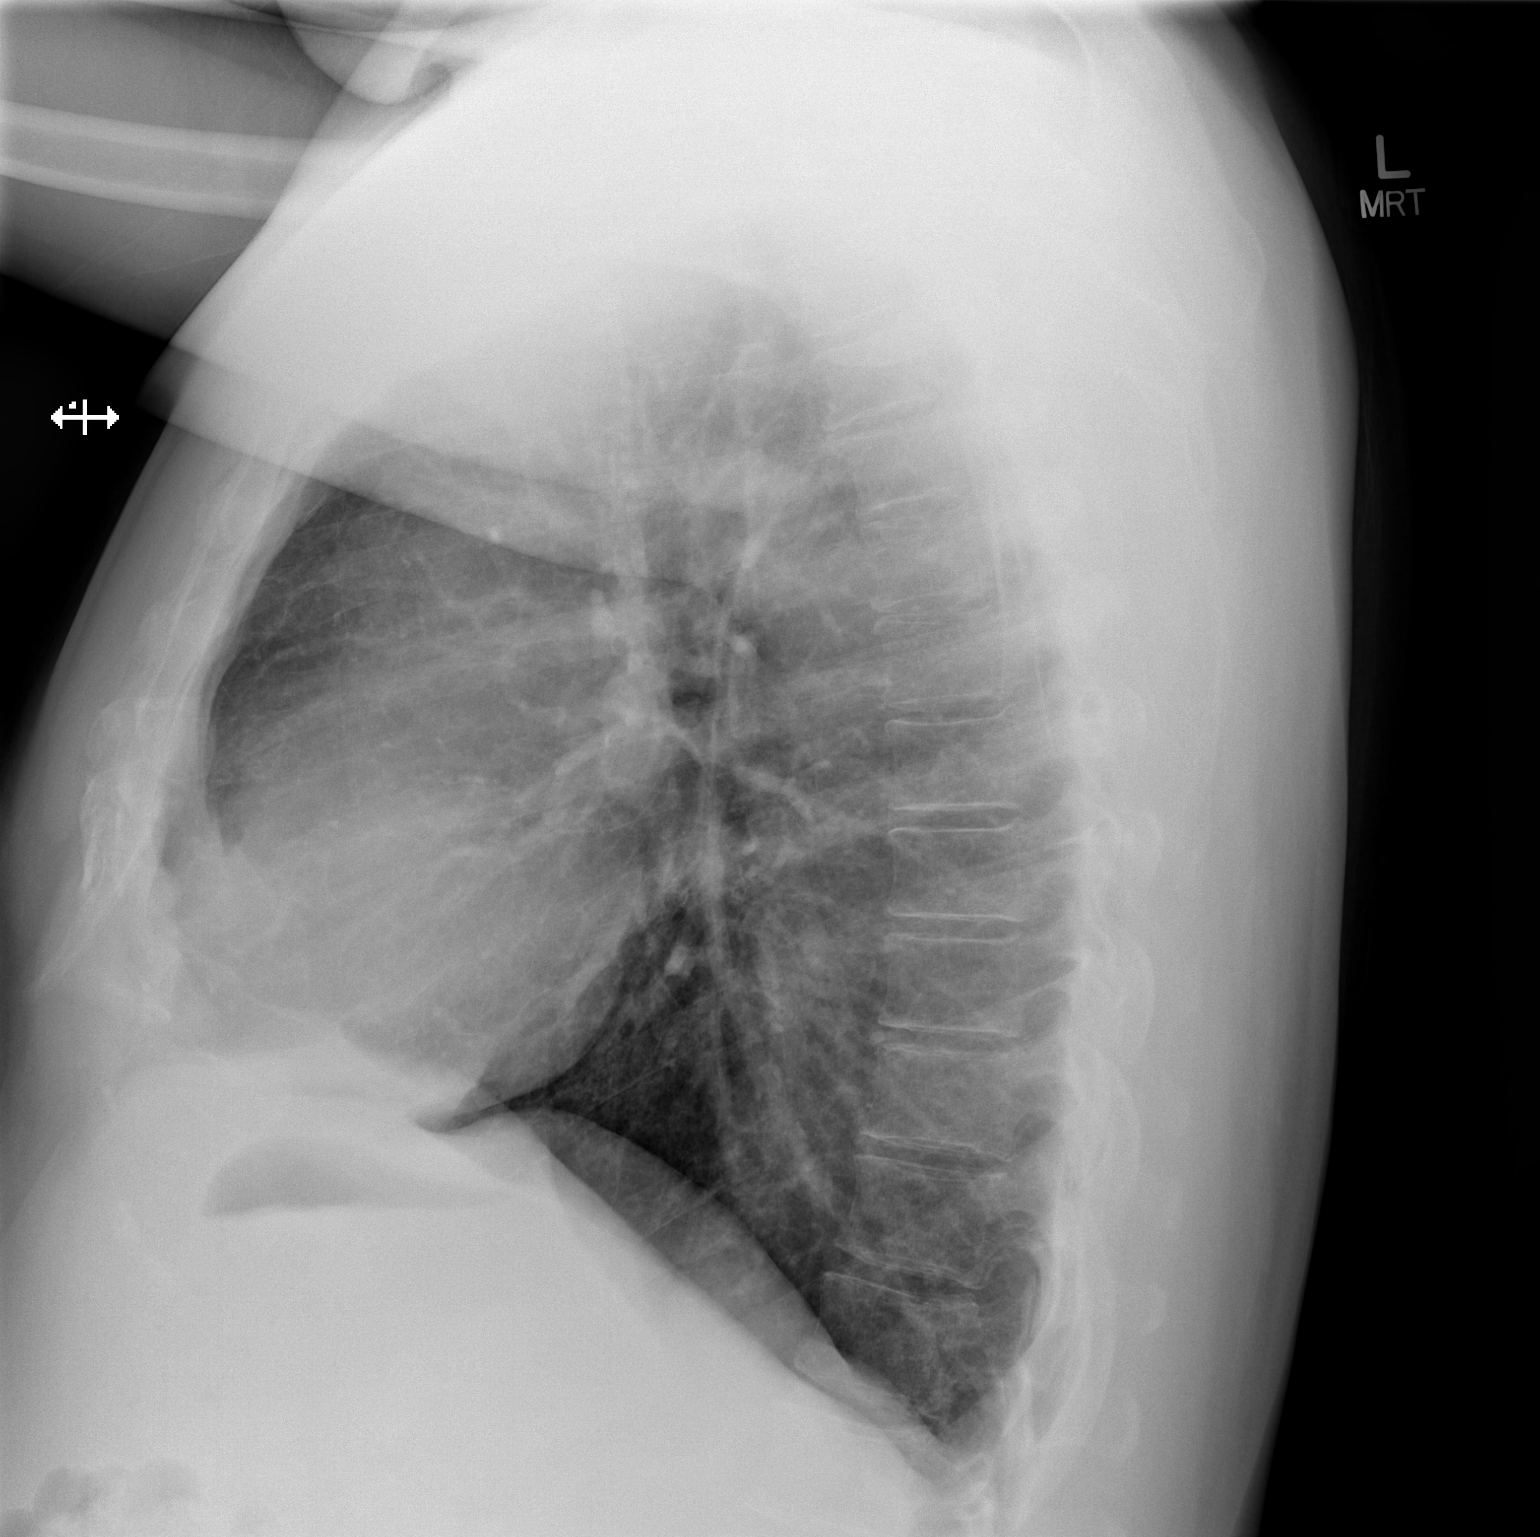

[2 of 2 positions shown; findings below may reference images not displayed]

FINDINGS: The heart size and mediastinal contours are within normal limits.
Both lungs are clear. The visualized skeletal structures are
unremarkable.
IMPRESSION: No active cardiopulmonary disease.

## 2018-02-27 IMAGING — CR DG SHOULDER 2+V PORT*R*
1 series · 1 of 1 positions shown · non-contrast
Comparison: None.

CLINICAL DATA: Status post total shoulder replacement

EXAM:
PORTABLE RIGHT SHOULDER:  1 V

[AP]
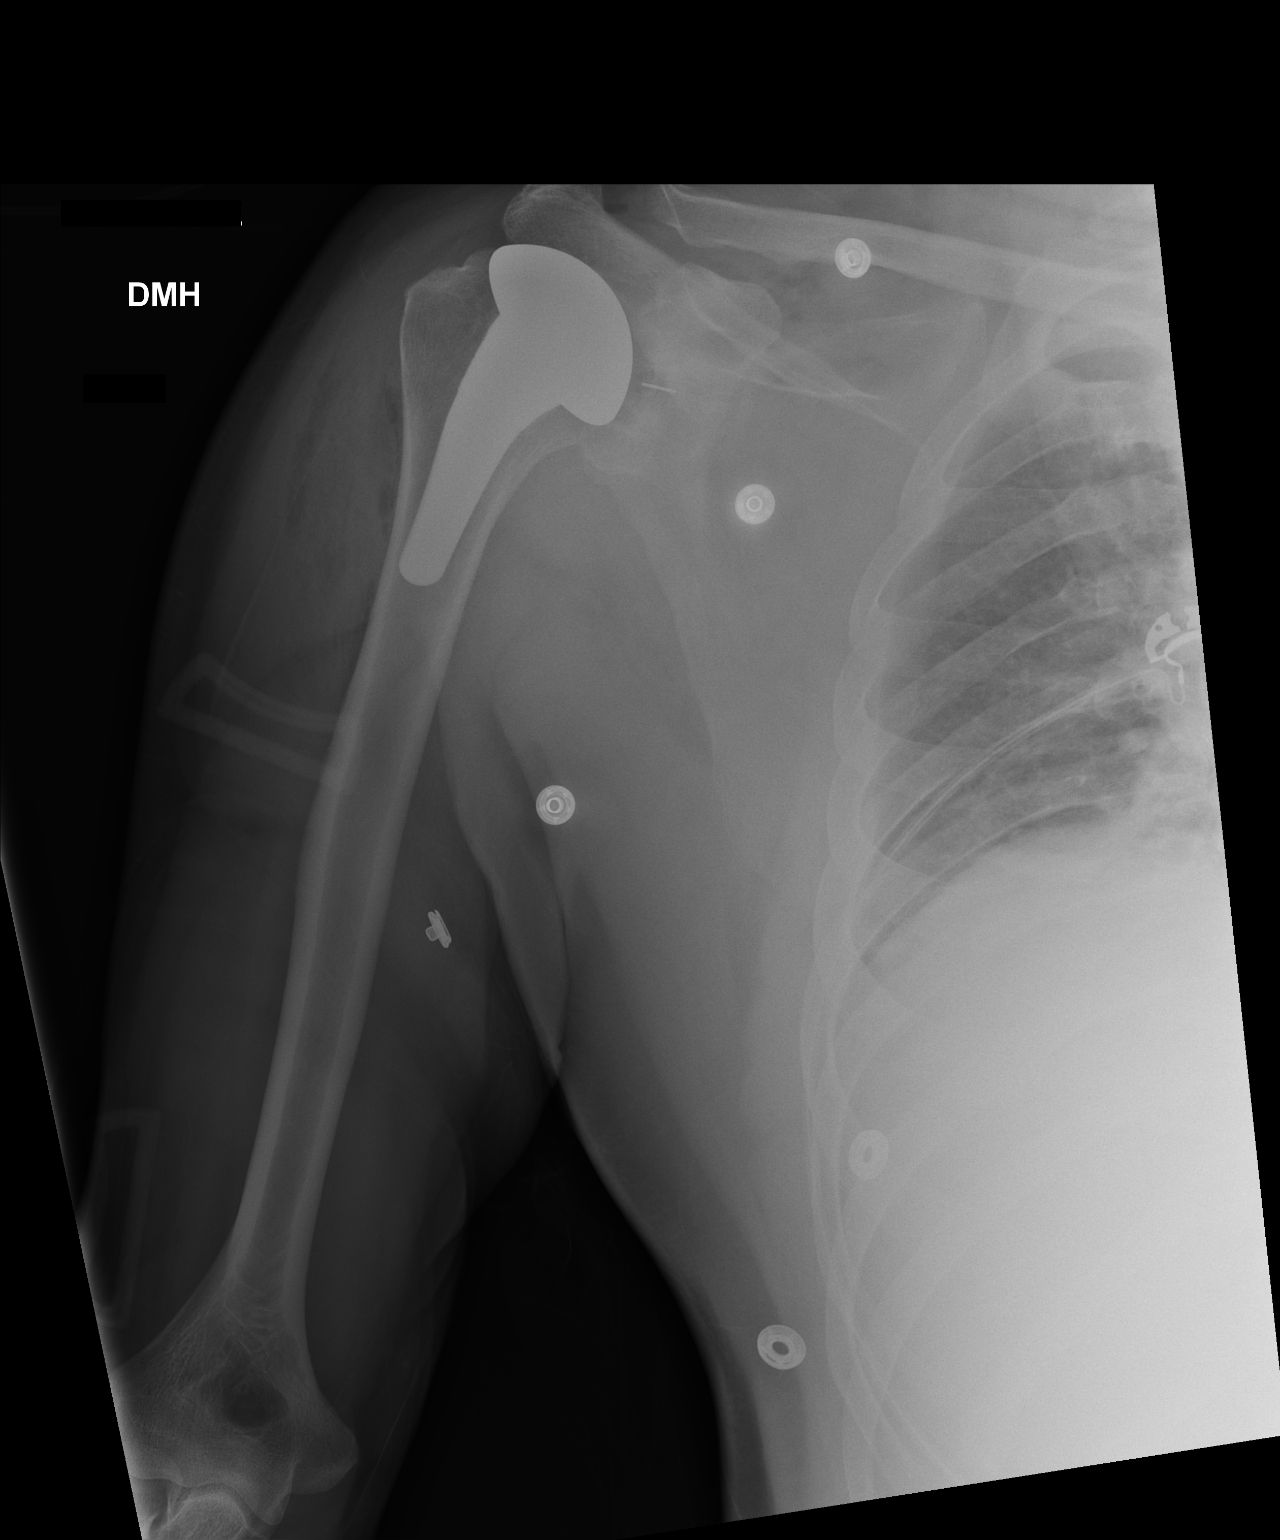

[1 of 1 positions shown; findings below may reference images not displayed]

FINDINGS: Frontal view obtained. Total shoulder prosthesis well-seated. No
fracture or dislocation. Evidence of old trauma involving the
lateral right clavicle noted. Soft tissue air is an expected
postoperative finding.
IMPRESSION: Frontal view shows total shoulder prosthesis well-seated. No acute
fracture or dislocation.

## 2023-11-14 LAB — COLOGUARD: COLOGUARD: NEGATIVE

## 2024-07-09 ENCOUNTER — Other Ambulatory Visit: Payer: Self-pay | Admitting: Orthopedic Surgery

## 2024-07-09 DIAGNOSIS — G8929 Other chronic pain: Secondary | ICD-10-CM

## 2024-07-18 ENCOUNTER — Ambulatory Visit
Admission: RE | Admit: 2024-07-18 | Discharge: 2024-07-18 | Disposition: A | Discharge status: Home / Self Care | Payer: Worker's Compensation | Source: Ambulatory Visit | Attending: Orthopedic Surgery | Admitting: Orthopedic Surgery

## 2024-07-18 ENCOUNTER — Ambulatory Visit
Admission: RE | Admit: 2024-07-18 | Discharge: 2024-07-18 | Disposition: A | Discharge status: Home / Self Care | Payer: Self-pay | Source: Ambulatory Visit | Attending: Orthopedic Surgery | Admitting: Orthopedic Surgery

## 2024-07-18 DIAGNOSIS — G8929 Other chronic pain: Secondary | ICD-10-CM

## 2024-07-18 MED ORDER — IOPAMIDOL (ISOVUE-M 200) INJECTION 41%
10.0000 mL | Freq: Once | INTRAMUSCULAR | Status: AC
  Administered 2024-07-18: 10 mL via INTRA_ARTICULAR

## 2024-07-25 ENCOUNTER — Other Ambulatory Visit: Payer: Self-pay | Admitting: Orthopedic Surgery

## 2024-07-25 DIAGNOSIS — M25511 Pain in right shoulder: Secondary | ICD-10-CM
